# Patient Record
Sex: Male | Born: 1975 | Race: Black or African American | Hispanic: No | Marital: Married | Smoking: Never smoker
Health system: Southern US, Community
[De-identification: ages and names within clinical notes are randomized; demographics above are authoritative.]

## PROBLEM LIST (undated history)

## (undated) HISTORY — PX: BACK SURGERY: SHX140

## (undated) HISTORY — PX: TOTAL SHOULDER REPLACEMENT: SUR1217

## (undated) HISTORY — PX: ROTATOR CUFF REPAIR: SHX139

## (undated) HISTORY — PX: HERNIA REPAIR: SHX51

## (undated) HISTORY — PX: APPENDECTOMY: SHX54

---

## 2018-01-01 ENCOUNTER — Encounter (HOSPITAL_COMMUNITY): Payer: Self-pay | Admitting: Emergency Medicine

## 2018-01-01 ENCOUNTER — Emergency Department (HOSPITAL_COMMUNITY)
Admission: EM | Admit: 2018-01-01 | Discharge: 2018-01-01 | Disposition: A | Discharge status: Home / Self Care | Attending: Emergency Medicine | Admitting: Emergency Medicine

## 2018-01-01 ENCOUNTER — Emergency Department (HOSPITAL_COMMUNITY)

## 2018-01-01 DIAGNOSIS — J302 Other seasonal allergic rhinitis: Secondary | ICD-10-CM | POA: Insufficient documentation

## 2018-01-01 DIAGNOSIS — R05 Cough: Secondary | ICD-10-CM | POA: Diagnosis present

## 2018-01-01 MED ORDER — MONTELUKAST SODIUM 10 MG PO TABS: 10.0000 mg | ORAL_TABLET | Freq: Every day | ORAL | 1 refills | Status: AC

## 2018-01-01 MED ORDER — FLUTICASONE PROPIONATE 50 MCG/ACT NA SUSP: 2.0000 | Freq: Every day | NASAL | 2 refills | Status: AC

## 2018-01-01 NOTE — ED Provider Notes (Signed)
[name] Provider Note   CSN: [id number] Arrival date & time: 01/01/18  1422     History   Chief Complaint Chief Complaint  Patient presents with  . Cough    HPI Brent Martinez is a 42 y.o. male.  HPI Brent Martinez is a [age] y.o. male presents to ED with complaint of nasal congest, post nasal drainage, itchy and watery eyes, cough. Pt states symptoms worse for about 2 weeks.  Patient states he has seasonal allergies and this happens every year twice a year around this time.  He states he has had some fever with this as well up to 102, but states that is normal for his allergies.  Patient states he has been on Chlor-Trimeton in the past for his allergies but states they are taking it off the shelf and he does not know what to take.  He states that is the only thing they would work for him.  He is currently taking Zyrtec which is not helping.  He is not taking any other medications.  He states his cough is worse at nighttime.  He is unable to sleep due to cough.  He states he is from [place], currently transitioning to a new primary care doctor through the [facility], first appointment in 1 month.  History reviewed. No pertinent past medical history.  There are no active problems to display for this patient.   Past Surgical History:  Procedure Laterality Date  . APPENDECTOMY    . BACK SURGERY    . HERNIA REPAIR    . ROTATOR CUFF REPAIR Right   . TOTAL SHOULDER REPLACEMENT Left         Home Medications    Prior to Admission medications   Not on File    Family History History reviewed. No pertinent family history.  Social History Social History   Tobacco Use  . Smoking status: Never Smoker  . Smokeless tobacco: Never Used  Substance Use Topics  . Alcohol use: Not Currently  . Drug use: Never     Allergies   Patient has no allergy information on record.   Review of Systems Review of Systems  Constitutional: Positive for  chills and fever.  HENT: Positive for congestion, rhinorrhea and sneezing. Negative for sore throat.   Eyes: Positive for itching.  Respiratory: Positive for cough. Negative for chest tightness and shortness of breath.   Cardiovascular: Negative for chest pain, palpitations and leg swelling.  Gastrointestinal: Negative for abdominal distention, abdominal pain, diarrhea, nausea and vomiting.  Genitourinary: Negative for dysuria, frequency, hematuria and urgency.  Musculoskeletal: Negative for arthralgias, myalgias, neck pain and neck stiffness.  Skin: Negative for rash.  Allergic/Immunologic: Negative for immunocompromised state.  Neurological: Negative for dizziness, weakness, light-headedness, numbness and headaches.  All other systems reviewed and are negative.    Physical Exam Updated Vital Signs BP 136/87 (BP Location: Right Arm)   Pulse 69   Temp 98.7 F (37.1 C) (Oral)   Resp 16   SpO2 94%   Physical Exam  Constitutional: He appears well-developed and well-nourished. No distress.  HENT:  Head: Normocephalic and atraumatic.  Right Ear: External ear normal.  Left Ear: External ear normal.  Rhinorrhea. TMs normal bilaterally. Normal oropharynx   Eyes: Conjunctivae are normal.  Neck: Normal range of motion. Neck supple.  Cardiovascular: Normal rate, regular rhythm and normal heart sounds.  Pulmonary/Chest: Effort normal. No respiratory distress. He has no wheezes. He has no rales.  Abdominal: Soft.  Bowel sounds are normal. He exhibits no distension. There is no tenderness. There is no rebound.  Musculoskeletal: He exhibits no edema.  Neurological: He is alert.  Skin: Skin is warm and dry.  Nursing note and vitals reviewed.    ED Treatments / Results  Labs (all labs ordered are listed, but only abnormal results are displayed) Labs Reviewed - No data to display  EKG None  Radiology Dg Chest 2 View  Result Date: 01/01/2018 CLINICAL DATA:  Productive cough. EXAM:  CHEST - 2 VIEW COMPARISON:  None. FINDINGS: The heart size and mediastinal contours are within normal limits. Both lungs are clear. No pneumothorax or pleural effusion is noted. Status post left shoulder arthroplasty. IMPRESSION: No active cardiopulmonary disease. Electronically Signed   By: Lupita Raider, M.D.   On: 01/01/2018 15:03    Procedures Procedures (including critical care time)  Medications Ordered in ED Medications - No data to display   Initial Impression / Assessment and Plan / ED Course  I have reviewed the triage vital signs and the nursing notes.  Pertinent labs & imaging results that were available during my care of the patient were reviewed by me and considered in my medical decision making (see chart for details).     Patient with seasonal allergies, reports sneezing, watery and itchy eyes, cough, unable to sleep.  He also confirms that he has had fever up to 102, Him that he might of had a viral infection moving as was triggering his allergies, however patient states he always has fever with his allergies.  He is to take Chlor-Trimeton but states they are taking it off the shelves and he would like to try something else.  Over-the-counter Zyrtec is not helping.  We will try Singulair and Flonase.  Patient has an appointment in 1 month with his primary care doctor.  We discussed elevating head of the bed at nighttime, following up closely, return precautions discussed.  Vitals:   01/01/18 1432  BP: 136/87  Pulse: 69  Resp: 16  Temp: 98.7 F (37.1 C)  SpO2: 94%     Final Clinical Impressions(s) / ED Diagnoses   Final diagnoses:  Seasonal allergies    ED Discharge Orders         Ordered    montelukast (SINGULAIR) 10 MG tablet  Daily at bedtime     01/01/18 1535    fluticasone (FLONASE) 50 MCG/ACT nasal spray  Daily     01/01/18 1535           Trellis Moment [place], PA-C 01/01/18 1536    Raeford Razor, MD 01/06/18 1442

## 2018-01-01 NOTE — ED Triage Notes (Signed)
Pt presents to ED for assessment of cough x 1.5 weeks.  Presented with his father with same symptoms.  States sometimes clear phlegm, sometimes green.  States he tried Zyrtec for almost 7 days without relief.

## 2018-01-01 NOTE — ED Notes (Signed)
Called for in lobby, no response °

## 2018-01-01 NOTE — Discharge Instructions (Addendum)
Start taking medications as prescribed.  Follow-up with family doctor soon as you are able.  Return if worsening symptoms.  Your chest x-ray and your vital signs are normal today.

## 2018-03-03 ENCOUNTER — Encounter: Payer: Self-pay | Admitting: Neurology

## 2018-03-17 DIAGNOSIS — F4310 Post-traumatic stress disorder, unspecified: Secondary | ICD-10-CM | POA: Insufficient documentation

## 2018-03-17 DIAGNOSIS — G959 Disease of spinal cord, unspecified: Secondary | ICD-10-CM | POA: Insufficient documentation

## 2018-03-17 DIAGNOSIS — G43711 Chronic migraine without aura, intractable, with status migrainosus: Secondary | ICD-10-CM | POA: Insufficient documentation

## 2018-03-17 DIAGNOSIS — M4802 Spinal stenosis, cervical region: Secondary | ICD-10-CM | POA: Insufficient documentation

## 2018-03-17 DIAGNOSIS — G4733 Obstructive sleep apnea (adult) (pediatric): Secondary | ICD-10-CM | POA: Insufficient documentation

## 2018-03-17 DIAGNOSIS — Z889 Allergy status to unspecified drugs, medicaments and biological substances status: Secondary | ICD-10-CM | POA: Insufficient documentation

## 2018-03-17 DIAGNOSIS — G8928 Other chronic postprocedural pain: Secondary | ICD-10-CM | POA: Insufficient documentation

## 2018-03-31 DIAGNOSIS — M5020 Other cervical disc displacement, unspecified cervical region: Secondary | ICD-10-CM | POA: Insufficient documentation

## 2018-04-29 ENCOUNTER — Other Ambulatory Visit: Payer: Self-pay | Admitting: Emergency Medicine

## 2018-05-16 ENCOUNTER — Ambulatory Visit: Payer: Non-veteran care | Admitting: Neurology

## 2018-06-01 DIAGNOSIS — Z09 Encounter for follow-up examination after completed treatment for conditions other than malignant neoplasm: Secondary | ICD-10-CM | POA: Insufficient documentation
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
## 2018-06-22 ENCOUNTER — Encounter: Payer: Self-pay | Admitting: Neurology

## 2018-06-22 ENCOUNTER — Ambulatory Visit (INDEPENDENT_AMBULATORY_CARE_PROVIDER_SITE_OTHER): Payer: No Typology Code available for payment source | Admitting: Neurology

## 2018-06-22 VITALS — BP 124/86 | HR 87 | Ht 65.0 in | Wt 202.0 lb

## 2018-06-22 DIAGNOSIS — M542 Cervicalgia: Secondary | ICD-10-CM | POA: Diagnosis not present

## 2018-06-22 DIAGNOSIS — G8929 Other chronic pain: Secondary | ICD-10-CM | POA: Diagnosis not present

## 2018-06-22 DIAGNOSIS — G43009 Migraine without aura, not intractable, without status migrainosus: Secondary | ICD-10-CM

## 2018-06-22 MED ORDER — TOPIRAMATE 50 MG PO TABS: 50.0000 mg | ORAL_TABLET | Freq: Every day | ORAL | 3 refills | Status: DC

## 2018-06-22 MED ORDER — RIZATRIPTAN BENZOATE 10 MG PO TABS: ORAL_TABLET | ORAL | 3 refills | Status: DC

## 2018-06-22 NOTE — Patient Instructions (Signed)
Migraine Recommendations: 1.  Using the 25mg  tablet, increase topiramate to 1 tablet in morning and 2 tablets at night for 1 week.  Then switch to the 50mg  tablet and take 1 tablet twice daily. 2.  Stop sumatriptan.  Take rizatriptan 10mg  at earliest onset of headache.  May repeat dose once in 2 hours if needed.  Do not exceed two tablets in 24 hours. 3.  Limit use of pain relievers to no more than 2 days out of the week.  These medications include acetaminophen, ibuprofen, triptans and narcotics.  This will help reduce risk of rebound headaches. 4.  Be aware of common food triggers such as processed sweets, processed foods with nitrites (such as deli meat, hot dogs, sausages), foods with MSG, alcohol (such as wine), chocolate, certain cheeses, certain fruits (dried fruits, bananas, pineapple), vinegar, diet soda. 4.  Avoid caffeine 5.  Routine exercise 6.  Proper sleep hygiene 7.  Stay adequately hydrated with water 8.  Keep a headache diary. 9.  Maintain proper stress management. 10.  Do not skip meals. 11.  Consider supplements:  Magnesium citrate 400mg  to 600mg  daily, riboflavin 400mg , Coenzyme Q 10 100mg  three times daily

## 2018-06-28 DIAGNOSIS — M24612 Ankylosis, left shoulder: Secondary | ICD-10-CM | POA: Insufficient documentation

## 2018-07-29 ENCOUNTER — Other Ambulatory Visit: Payer: Self-pay | Admitting: Orthopedic Surgery

## 2018-07-29 ENCOUNTER — Other Ambulatory Visit (HOSPITAL_COMMUNITY): Payer: Self-pay | Admitting: Orthopedic Surgery

## 2018-07-29 DIAGNOSIS — Z96612 Presence of left artificial shoulder joint: Secondary | ICD-10-CM

## 2018-09-08 ENCOUNTER — Encounter (HOSPITAL_COMMUNITY)
Admission: RE | Admit: 2018-09-08 | Discharge: 2018-09-08 | Disposition: A | Discharge status: Home / Self Care | Source: Ambulatory Visit | Attending: Orthopedic Surgery | Admitting: Orthopedic Surgery

## 2018-09-08 ENCOUNTER — Other Ambulatory Visit: Payer: Self-pay

## 2018-09-08 DIAGNOSIS — Z96612 Presence of left artificial shoulder joint: Secondary | ICD-10-CM | POA: Insufficient documentation

## 2018-09-08 MED ORDER — TECHNETIUM TC 99M MEDRONATE IV KIT
21.3000 | PACK | Freq: Once | INTRAVENOUS | Status: AC | PRN
  Administered 2018-09-08: 21.3 via INTRAVENOUS

## 2018-10-26 ENCOUNTER — Encounter: Payer: Self-pay | Admitting: Neurology
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
## 2018-10-28 ENCOUNTER — Telehealth (INDEPENDENT_AMBULATORY_CARE_PROVIDER_SITE_OTHER): Payer: No Typology Code available for payment source | Admitting: Neurology

## 2018-10-28 ENCOUNTER — Other Ambulatory Visit: Payer: Self-pay

## 2018-10-28 ENCOUNTER — Encounter: Payer: Self-pay | Admitting: Neurology

## 2018-10-28 VITALS — Ht 65.0 in | Wt 200.0 lb

## 2018-10-28 DIAGNOSIS — G43009 Migraine without aura, not intractable, without status migrainosus: Secondary | ICD-10-CM | POA: Diagnosis not present

## 2018-10-28 MED ORDER — TOPIRAMATE 50 MG PO TABS: ORAL_TABLET | ORAL | 0 refills | Status: AC

## 2018-10-28 MED ORDER — SUMATRIPTAN SUCCINATE 6 MG/0.5ML ~~LOC~~ SOAJ: 6.0000 mg | SUBCUTANEOUS | 3 refills | Status: AC | PRN

## 2020-05-13 IMAGING — NM NUCLEAR MEDICINE THREE PHASE BONE SCAN
10 series · 20 of 20 positions shown · non-contrast
Comparison: Radiograph 03/25/2018

CLINICAL DATA: LEFT shoulder prosthetic. Evaluate for loosening
infection

EXAM:
NUCLEAR MEDICINE 3-PHASE BONE SCAN
TECHNIQUE: Radionuclide angiographic images, immediate static blood pool
images, and 3-hour delayed static images were obtained of the
shoulders after intravenous injection of radiopharmaceutical.
RADIOPHARMACEUTICALS:  21.3 mCi 7c-CCm MDP IV

[Series 1: flow · 2.07mm/px · 6 of 48 frames shown (1 of 2)]
[frame 5/48]
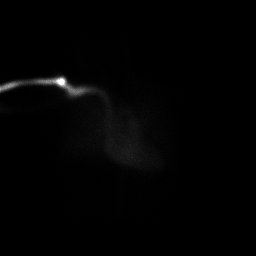
[frame 13/48]
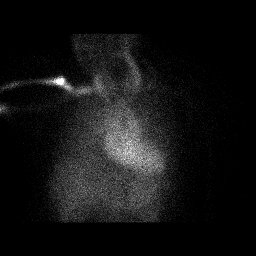
[frame 21/48]
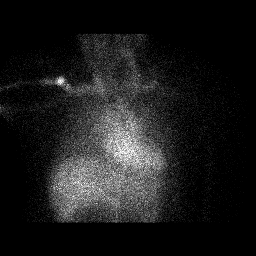
[frame 29/48]
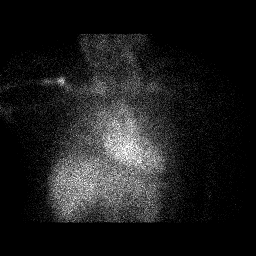
[frame 37/48]
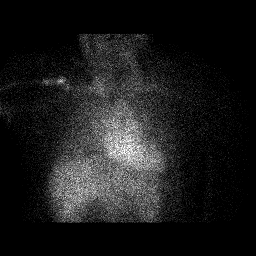
[frame 45/48]
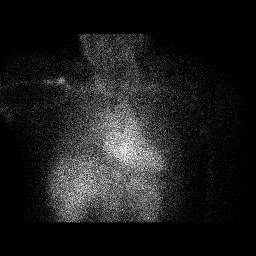

[Series 1: flow · 2.07mm/px · 6 of 48 frames shown (2 of 2)]
[frame 5/48  full-range]
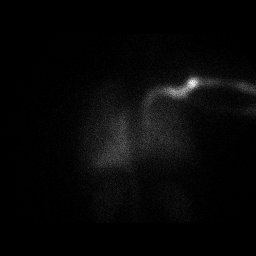
[frame 13/48  full-range]
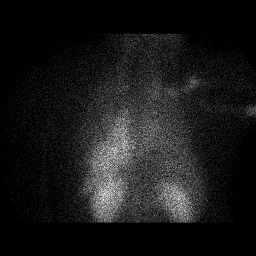
[frame 21/48  full-range]
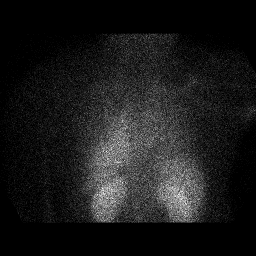
[frame 29/48  full-range]
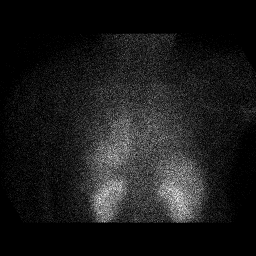
[frame 37/48  full-range]
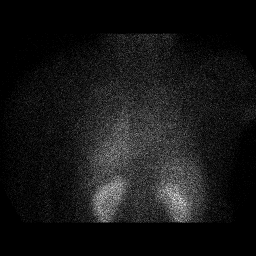
[frame 45/48  full-range]
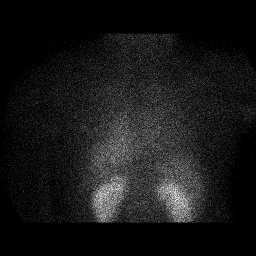

[Series 2: blood pool · 2.07mm/px · 1 of 1 slices shown (1 of 2)]
[im 1/1]
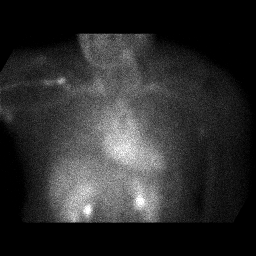

[Series 2: blood pool · 2.07mm/px · 1 of 1 slices shown (2 of 2)]
[im 1/1]
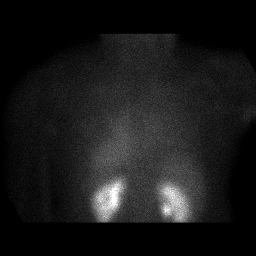

[Series 3: lat bp · 2.07mm/px · 1 of 1 slices shown (1 of 2)]
[im 1/1]
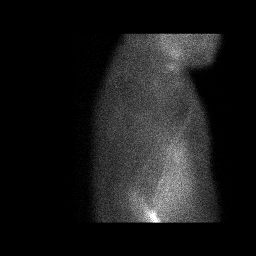

[Series 3: lat bp · 2.07mm/px · 1 of 1 slices shown (2 of 2)]
[im 1/1]
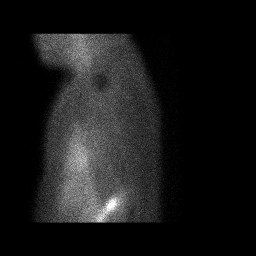

[Series 4: delay · delayed · 2.07mm/px · 1 of 1 slices shown (1 of 4)]
[im 1/1]
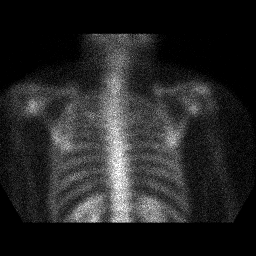

[Series 4: delay · delayed · 2.07mm/px · 1 of 1 slices shown (2 of 4)]
[im 1/1]
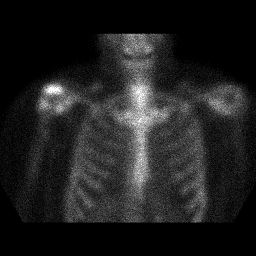

[Series 5: delay · delayed · 2.07mm/px · 1 of 1 slices shown (3 of 4)]
[im 1/1]
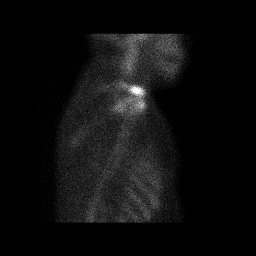

[Series 5: delay · delayed · 2.07mm/px · 1 of 1 slices shown (4 of 4)]
[im 1/1]
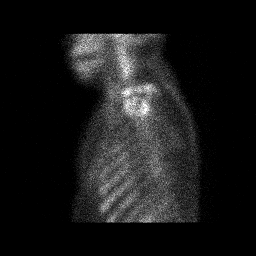

[20 of 20 positions shown; findings below may reference images not displayed]

FINDINGS: Vascular phase: No increased vascular flow to the LEFT shoulder.

Blood pool phase: Blood pool imaging demonstrates no abnormal
activity in LEFT shoulder.

Delayed phase: Delayed imaging demonstrate minimal activity in LEFT
shoulder. No abnormal activity in glenoid fossa or proximal humerus.
Degenerate uptake noted in the RIGHT acromioclavicular joint.
IMPRESSION: No evidence of loosening or infection of the LEFT shoulder
prosthetic.
